# Patient Record
Sex: Female | Born: 1955 | Race: White | Hispanic: No | Marital: Married | Smoking: Former smoker
Health system: Southern US, Community
[De-identification: ages and names within clinical notes are randomized; demographics above are authoritative.]

---

## 1999-10-24 ENCOUNTER — Encounter (INDEPENDENT_AMBULATORY_CARE_PROVIDER_SITE_OTHER): Payer: Self-pay

## 1999-10-24 ENCOUNTER — Inpatient Hospital Stay (HOSPITAL_COMMUNITY): Admission: RE | Admit: 1999-10-24 | Discharge: 1999-10-26 | Payer: Self-pay | Admitting: *Deleted

## 2004-08-03 ENCOUNTER — Ambulatory Visit (HOSPITAL_COMMUNITY): Admission: RE | Admit: 2004-08-03 | Discharge: 2004-08-03 | Payer: Self-pay | Admitting: Family Medicine

## 2004-08-03 ENCOUNTER — Encounter: Admission: RE | Admit: 2004-08-03 | Discharge: 2004-08-03 | Payer: Self-pay | Admitting: Family Medicine

## 2004-08-13 ENCOUNTER — Encounter: Admission: RE | Admit: 2004-08-13 | Discharge: 2004-08-13 | Payer: Self-pay | Admitting: Family Medicine

## 2005-04-09 ENCOUNTER — Encounter: Admission: RE | Admit: 2005-04-09 | Discharge: 2005-04-09 | Payer: Self-pay | Admitting: Family Medicine

## 2005-07-15 IMAGING — US US EXTREM LOW NON VASC*R*
1 series · 14 of 25 positions shown · non-contrast
Comparison: none

CLINICAL DATA: Soft, palpable mass in the mid to upper right arm. 
RIGHT ARM ULTRASOUND ? 08/03/04
Sonographic examination of the palpable mass in the right arm was performed.  At that location, a 5.0 x 4.1 x 2.1 cm mass is demonstrated. This is mildly inhomogeneous and has smooth margins.  No other abnormalities are seen. 
IMPRESSION 
5.0 x 4.1 x 2.1 cm solid mass in the mid right arm, corresponding to the palpable abnormality.  This has nonspecific ultrasound features and most likely represents a lipoma.  However, ultrasound is not a modality with the ability to differentiate fatty from non-fatty masses.  Therefore, this could also represent a nonfatty mass.  MR or CT would be more specific for determining the presence or absence of fat in the mass.

[Series 1: unknown · 0.09mm/px · 14 of 26 slices shown]
[im 1/26]
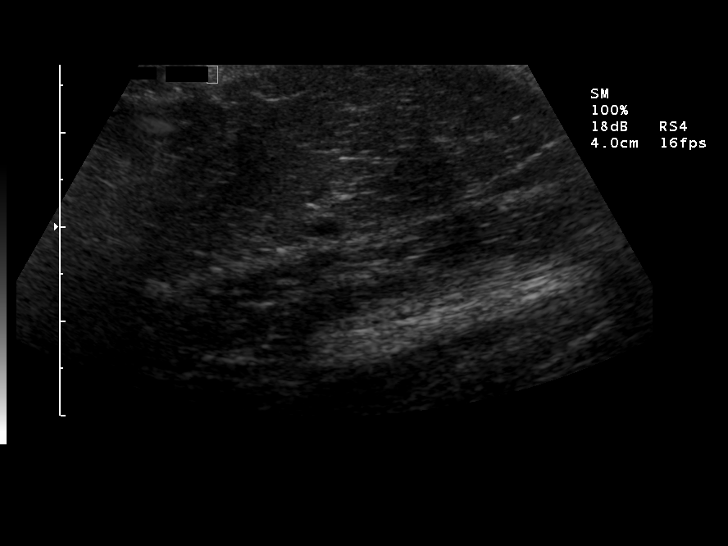
[im 3/26]
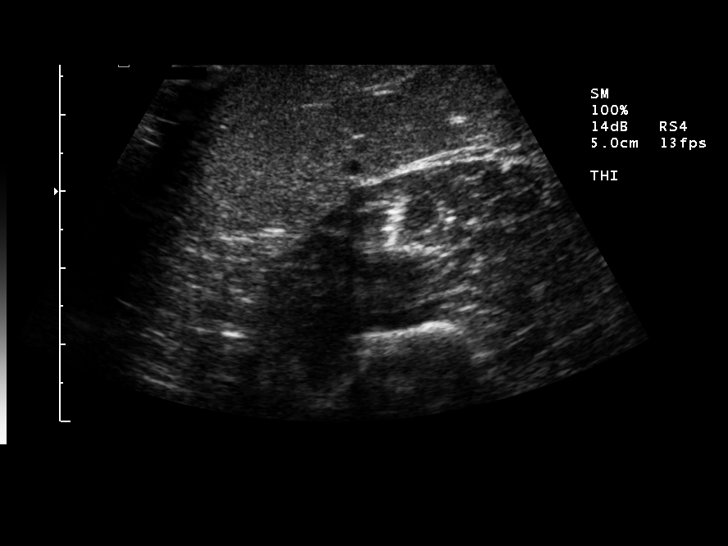
[im 5/26]
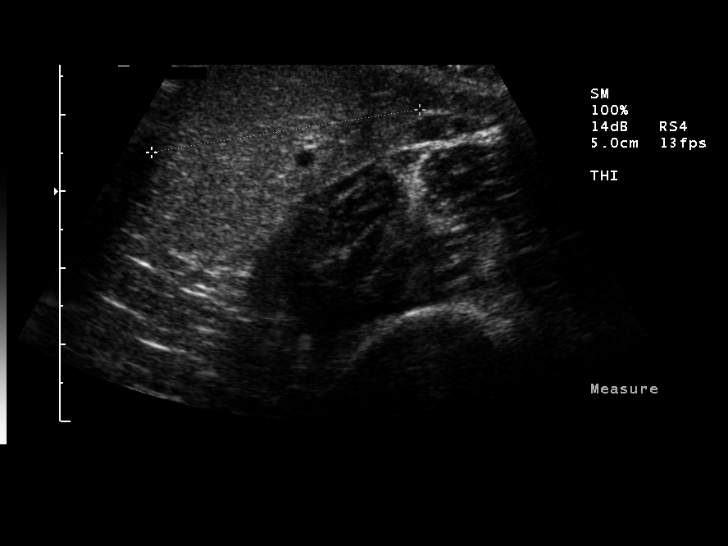
[im 7/26]
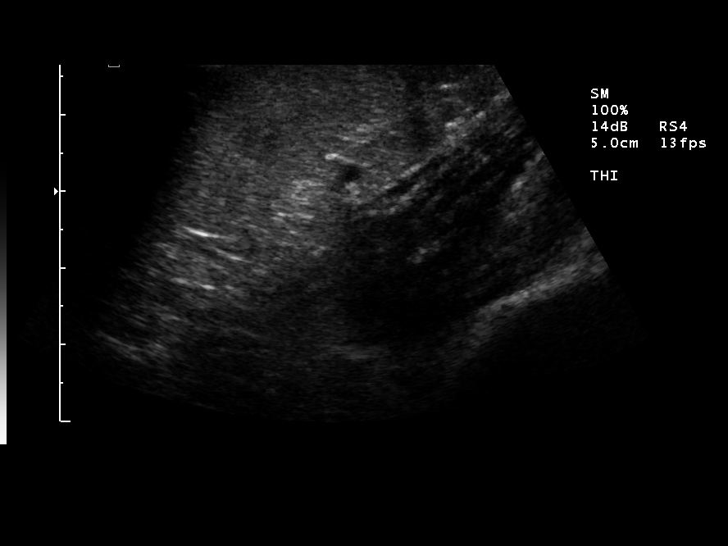
[im 9/26]
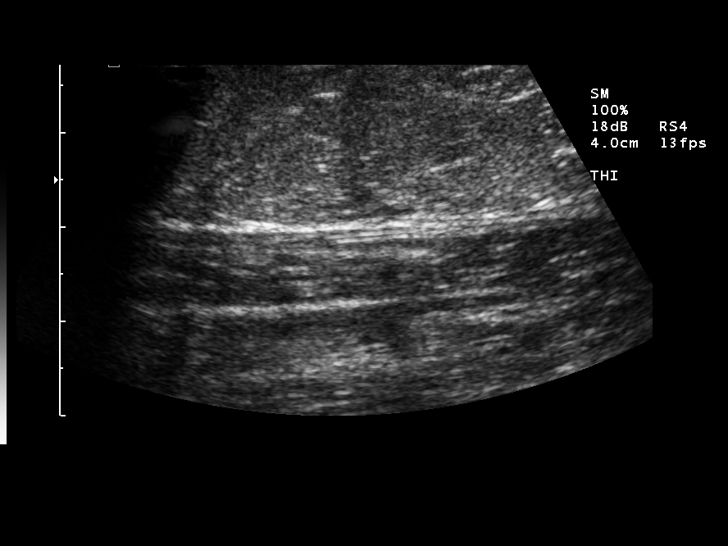
[im 10/26]
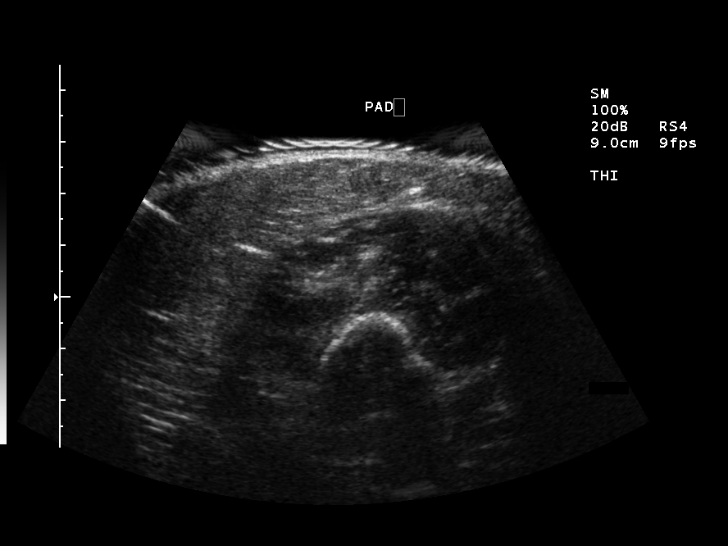
[im 12/26]
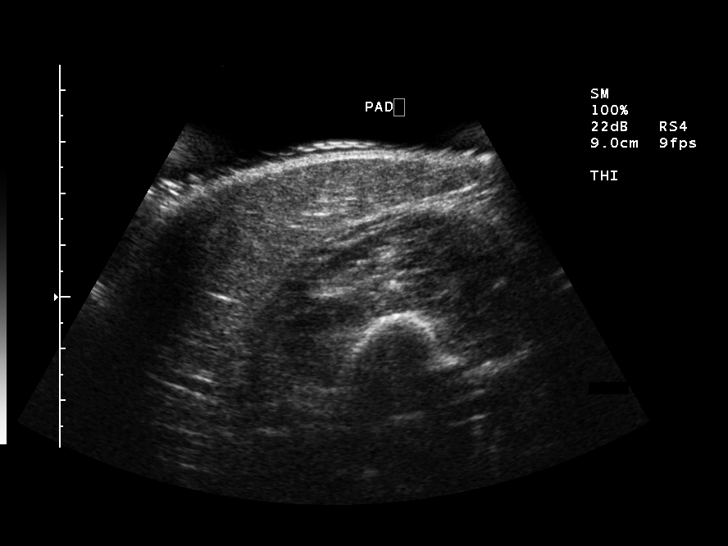
[im 14/26]
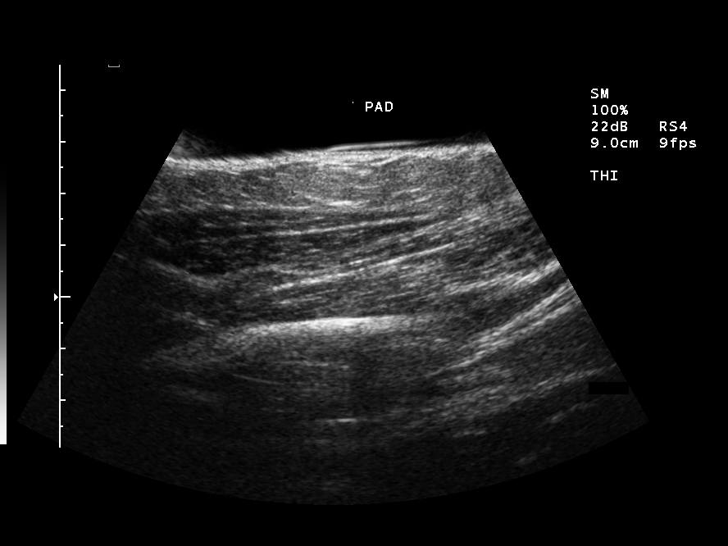
[im 16/26]
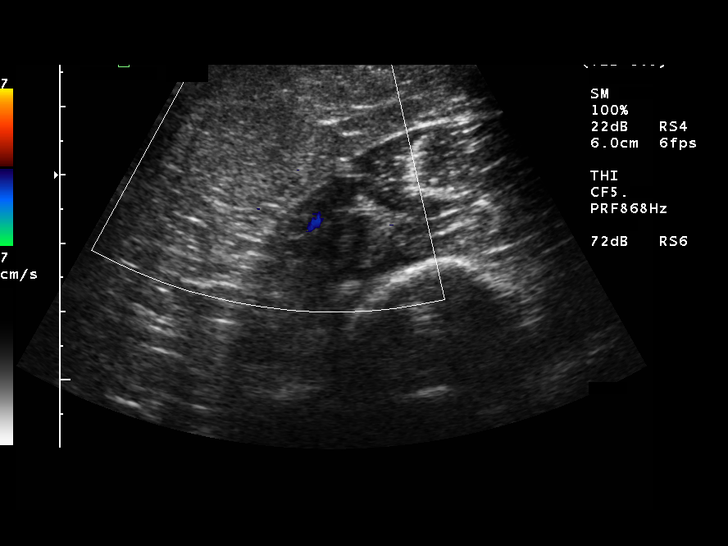
[im 17/26]
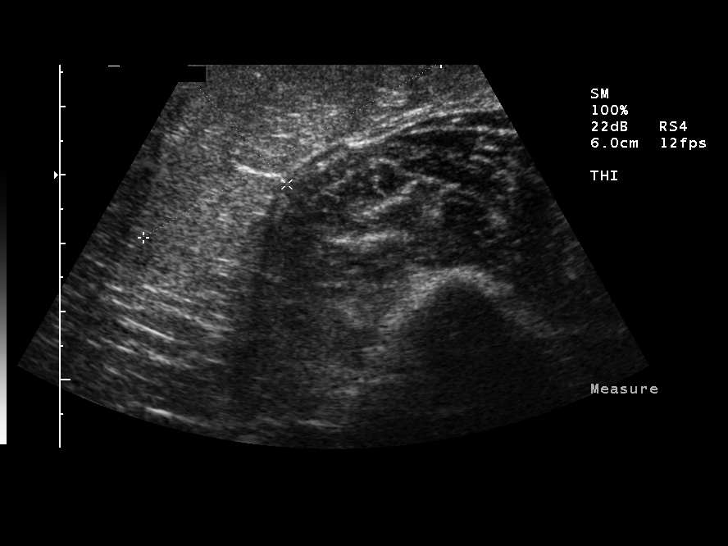
[im 19/26]
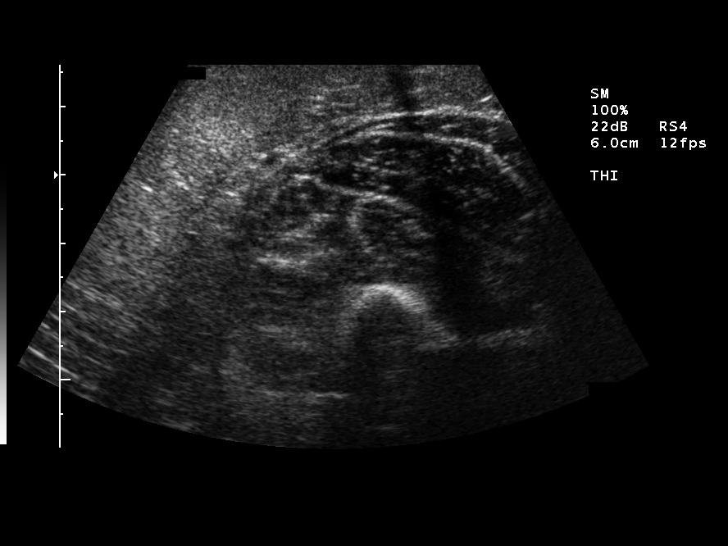
[im 21/26]
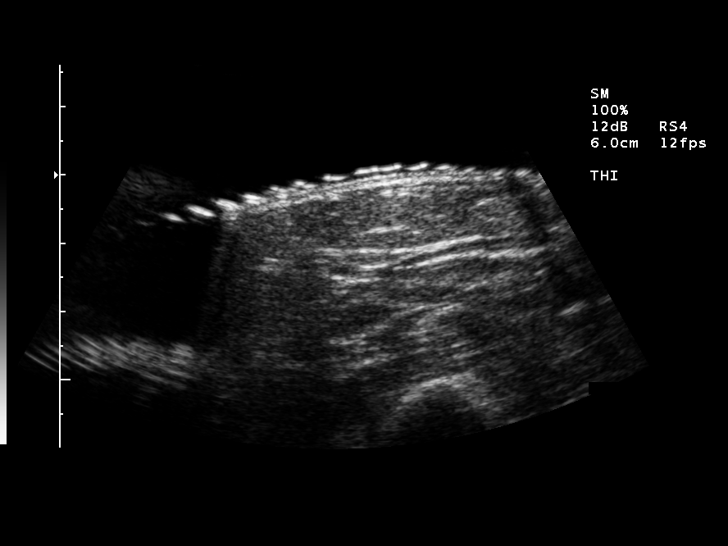
[im 23/26]
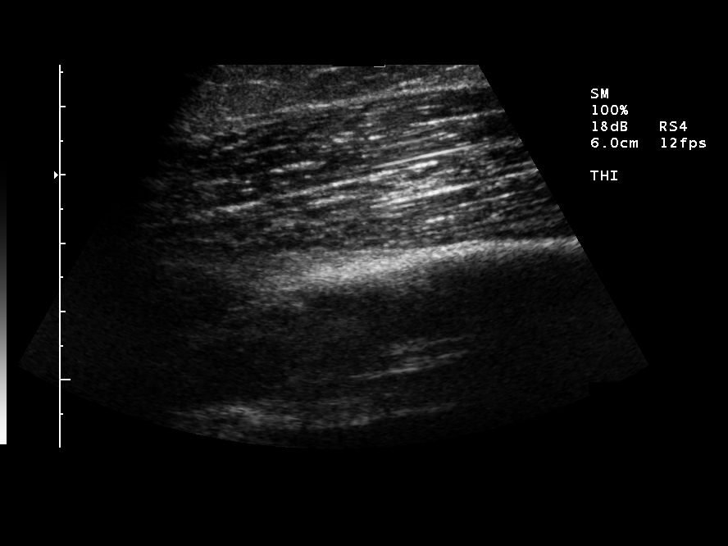
[im 26/26]
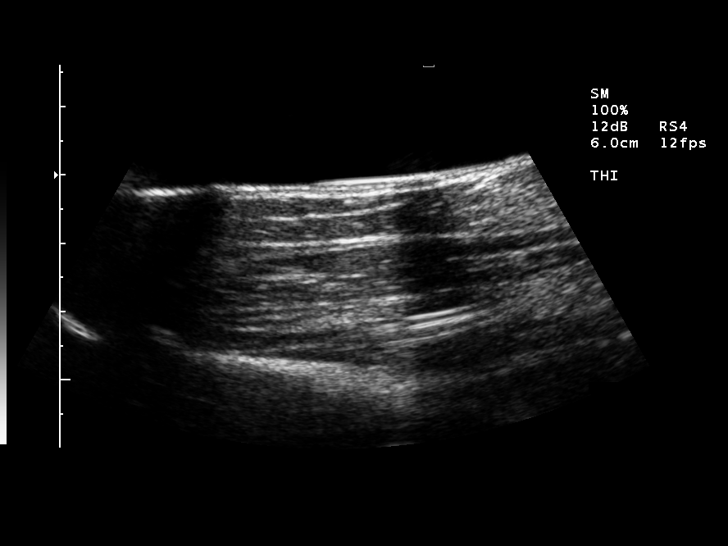

[14 of 25 positions shown; findings below may reference images not displayed]

## 2006-01-14 ENCOUNTER — Encounter: Admission: RE | Admit: 2006-01-14 | Discharge: 2006-01-14 | Payer: Self-pay | Admitting: Family Medicine

## 2007-01-30 ENCOUNTER — Encounter: Admission: RE | Admit: 2007-01-30 | Discharge: 2007-01-30 | Payer: Self-pay | Admitting: Family Medicine

## 2007-05-18 ENCOUNTER — Other Ambulatory Visit: Admission: RE | Admit: 2007-05-18 | Discharge: 2007-05-18 | Payer: Self-pay | Admitting: Family Medicine

## 2008-03-10 ENCOUNTER — Encounter: Admission: RE | Admit: 2008-03-10 | Discharge: 2008-03-10 | Payer: Self-pay | Admitting: Family Medicine

## 2009-03-20 ENCOUNTER — Encounter: Admission: RE | Admit: 2009-03-20 | Discharge: 2009-03-20 | Payer: Self-pay | Admitting: Family Medicine

## 2010-04-16 ENCOUNTER — Encounter: Admission: RE | Admit: 2010-04-16 | Discharge: 2010-04-16 | Payer: Self-pay | Admitting: Family Medicine

## 2010-04-20 ENCOUNTER — Encounter: Admission: RE | Admit: 2010-04-20 | Discharge: 2010-04-20 | Payer: Self-pay | Admitting: Family Medicine

## 2011-01-20 ENCOUNTER — Encounter: Payer: Self-pay | Admitting: Family Medicine

## 2011-02-28 ENCOUNTER — Encounter (HOSPITAL_BASED_OUTPATIENT_CLINIC_OR_DEPARTMENT_OTHER)
Admission: RE | Admit: 2011-02-28 | Discharge: 2011-02-28 | Disposition: A | Discharge status: Home / Self Care | Payer: BC Managed Care – PPO | Source: Ambulatory Visit | Attending: Orthopedic Surgery | Admitting: Orthopedic Surgery

## 2011-02-28 DIAGNOSIS — Z01810 Encounter for preprocedural cardiovascular examination: Secondary | ICD-10-CM | POA: Insufficient documentation

## 2011-03-01 ENCOUNTER — Other Ambulatory Visit: Payer: Self-pay | Admitting: Orthopedic Surgery

## 2011-03-01 ENCOUNTER — Ambulatory Visit (HOSPITAL_BASED_OUTPATIENT_CLINIC_OR_DEPARTMENT_OTHER)
Admission: RE | Admit: 2011-03-01 | Discharge: 2011-03-01 | Disposition: A | Discharge status: Home / Self Care | Payer: BC Managed Care – PPO | Source: Ambulatory Visit | Attending: Orthopedic Surgery | Admitting: Orthopedic Surgery

## 2011-03-01 DIAGNOSIS — D1739 Benign lipomatous neoplasm of skin and subcutaneous tissue of other sites: Secondary | ICD-10-CM | POA: Insufficient documentation

## 2011-03-01 DIAGNOSIS — Z01812 Encounter for preprocedural laboratory examination: Secondary | ICD-10-CM | POA: Insufficient documentation

## 2011-03-01 LAB — POCT HEMOGLOBIN-HEMACUE: Hemoglobin: 13.1 g/dL (ref 12.0–15.0)

## 2011-07-19 NOTE — Op Note (Signed)
  NAME:  Leslie Wilkerson, Leslie Wilkerson                  ACCOUNT NO.:  [id number]  MEDICAL RECORD NO.:  [id number]            PATIENT TYPE:  LOCATION:                                 FACILITY:  PHYSICIAN:  Cindee Salt, M.D.            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  OPERATION:  Excisional biopsy mass, left arm.  SURGEON:  Cindee Salt, MD  ANESTHESIA:  Infraclavicular block general anesthesia.  ANESTHESIOLOGIST:  Janetta Hora. Frederick, MD  HISTORY:  The patient is a 55 year old female with a history of a large mass in the antecubital fossa area, distal upper arm of her right arm. She is desirous of having this excised.  Pre, peri, postoperative courses have been discussed along with risks and complications.  She is aware that there is no guarantee with surgery; possibility of infection; recurrence to injury to arteries, nerves, tendons; complete relief of symptoms and dystrophy.  In the preoperative area, the patient is seen, the extremity marked by both the patient and surgeon, and antibiotic given.  PROCEDURE:  The patient was brought to the operating room where an infraclavicular block, general anesthetic were both carried out by Dr. Gelene Mink without difficulty.  She was prepped using ChloraPrep, supine position, free right arm free.  A 3-minute dry time was allowed.  A time- out was taken confirming the patient and procedure.  A sterile tourniquet was used after draping on the upper arm.  The limb was exsanguinated by an Esmarch bandage up to the mass and then elevation. Tourniquet placed high on the arm was inflated to 250 mmHg.  A straight incision was made over the mass, carried down through the subcutaneous tissue with blunt  and sharp dissection.  A multilobulated large, firm, fatty-type mass was immediately encountered with blunt and sharp dissection, this was dissected free, it measured approximately 10 x 10 x 5 cm in size.  The entire mass was removed, marked distally with a suture and sent to pathology.  The wound was copiously irrigated with saline.  Bleeders were electrocauterized throughout the procedure with bipolar.  The wound was copiously irrigated with saline.  The subcutaneous tissue was then closed with interrupted 4-0 Vicryl sutures and the skin with interrupted 4-0 Vicryl Rapide sutures.  A sterile compressive dressing was applied.  On deflation of the tourniquet, all fingers were immediately pinked.  She was taken to the recovery room for observation in satisfactory condition.  She will be discharged home to return to the [facility] in 1 week, on Vicodin.    ______________________________ Cindee Salt, M.D.   ______________________________ Cindee Salt, M.D.    GK/MEDQ  D:  03/01/2011  T:  03/02/2011  Job:  [id number] Electronically Signed by Cindee Salt M.D. on 07/19/2011 09:00:44 AM

## 2011-12-26 ENCOUNTER — Ambulatory Visit: Payer: Self-pay

## 2012-07-06 ENCOUNTER — Other Ambulatory Visit: Payer: Self-pay | Admitting: Family Medicine

## 2012-07-06 DIAGNOSIS — Z1231 Encounter for screening mammogram for malignant neoplasm of breast: Secondary | ICD-10-CM

## 2012-07-15 ENCOUNTER — Ambulatory Visit
Admission: RE | Admit: 2012-07-15 | Discharge: 2012-07-15 | Disposition: A | Discharge status: Home / Self Care | Payer: BC Managed Care – PPO | Source: Ambulatory Visit | Attending: Family Medicine | Admitting: Family Medicine

## 2012-07-15 DIAGNOSIS — Z1231 Encounter for screening mammogram for malignant neoplasm of breast: Secondary | ICD-10-CM

## 2013-06-08 ENCOUNTER — Other Ambulatory Visit: Payer: Self-pay | Admitting: Family Medicine

## 2013-06-08 ENCOUNTER — Other Ambulatory Visit: Payer: Self-pay

## 2013-06-08 DIAGNOSIS — Z1231 Encounter for screening mammogram for malignant neoplasm of breast: Secondary | ICD-10-CM

## 2013-07-16 ENCOUNTER — Ambulatory Visit: Payer: BC Managed Care – PPO

## 2014-11-30 ENCOUNTER — Ambulatory Visit (INDEPENDENT_AMBULATORY_CARE_PROVIDER_SITE_OTHER): Payer: BC Managed Care – PPO | Admitting: Family Medicine

## 2014-11-30 ENCOUNTER — Encounter: Payer: Self-pay | Admitting: Family Medicine

## 2014-11-30 VITALS — BP 116/74 | HR 73 | Temp 98.5°F | Resp 16 | Ht 63.0 in | Wt 164.4 lb

## 2014-11-30 DIAGNOSIS — Z1239 Encounter for other screening for malignant neoplasm of breast: Secondary | ICD-10-CM

## 2014-11-30 DIAGNOSIS — Z23 Encounter for immunization: Secondary | ICD-10-CM

## 2014-11-30 NOTE — Progress Notes (Signed)
   Subjective:    Patient ID: Leslie Wilkerson, female    DOB: 11-14-1956, 58 y.o.   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 She sees Dr. Harlan Stains for primary care and they could not see her in enough time for the patient to have her paperwork completed.   Last pap- last year Mammo- 7/13 Flu-had already Tdap- needs, will get today  Current outpatient prescriptions: levothyroxine (SYNTHROID, LEVOTHROID) 75 MCG tablet, Take 75 mcg by mouth daily before breakfast., Disp: , Rfl:   No past medical history on file. No past surgical history on file. No family history on file. History  Substance Use Topics  . Smoking status: Former Research scientist (life sciences)  . Smokeless tobacco: Not on file  . Alcohol Use: Not on file     Review of Systems  Constitutional: Negative for fever, chills and unexpected weight change.  Respiratory: Negative for chest tightness and shortness of breath.   Cardiovascular: Negative for chest pain.  Gastrointestinal: Negative for diarrhea and constipation.  Neurological: Negative for headaches.       Objective:   Physical Exam  Constitutional: She is oriented to person, place, and time. She appears well-developed and well-nourished.  HENT:  Head: Normocephalic and atraumatic.  Right Ear: Tympanic membrane, external ear and ear canal normal.  Left Ear: Tympanic membrane, external ear and ear canal normal.  Nose: Nose normal.  Mouth/Throat: Oropharynx is clear and moist.  Eyes: Conjunctivae are normal. Pupils are equal, round, and reactive to light.  Neck: Normal range of motion. Neck supple.  Cardiovascular: Normal rate, regular rhythm and normal heart sounds.   Pulmonary/Chest: Effort normal and breath sounds normal.  Abdominal: Soft. Bowel sounds are normal.  Musculoskeletal: Normal range of motion.  Lymphadenopathy:    She has no cervical adenopathy.  Neurological: She is alert  and oriented to person, place, and time. She has normal reflexes.  Skin: Skin is warm and dry.  Psychiatric: She has a normal mood and affect. Her behavior is normal. Judgment and thought content normal.  Vitals reviewed.     Assessment & Plan:  1. Screening for breast cancer - MM Digital Screening; Future  2. Need for Tdap vaccination - Tdap vaccine greater than or equal to 7yo IM   Elby Beck, FNP-BC  [facility]  12/02/2014 10:40 PM

## 2014-11-30 NOTE — Progress Notes (Signed)
   Subjective:    Patient ID: Leslie Wilkerson, female    DOB: 01-25-1956, 58 y.o.   MRN: [id number]  HPI    Review of Systems     Objective:   Physical Exam        Assessment & Plan:  Tuberculosis Symptom Questionnaire  Do you currently have any of the following symptoms?  1. Unexplained cough lasting more than 3 weeks? no  Unexplained fever lasting more than 3 weeks. no  3. Night Sweats (sweating that leaves the bedclothes and sheets wet)   no  4. Shortness of Breath no  5. Chest Pain no  6. Unintentional weight loss  no  7. Unexplained fatigue (very tired for no reason) no Tuberculosis Risk Questionnaire  1. {no Were you born outside the [place] in one of the following parts of the world: [place], [place], [place], [place] or [place]?    2. No Have you traveled outside the [place] and lived for more than one month in one of the following parts of the world: [place], [place], [place], [place] or [place]?    3. no Do you have a compromised immune system such as from any of the following conditions:HIV/AIDS, organ or bone marrow transplantation, diabetes, immunosuppressive medicines (e.g. Prednisone, Remicaide), leukemia, lymphoma, cancer of the head or neck, gastrectomy or jejunal bypass, end-stage renal disease (on dialysis), or silicosis?     4. No Have you ever or do you plan on working in: a residential care center, a health care facility, a jail or prison or homeless shelter?    5. No Have you ever: injected illegal drugs, used crack cocaine, lived in a homeless shelter  or been in jail or prison?     6. No Have you ever been exposed to anyone with infectious tuberculosis?

## 2015-06-22 ENCOUNTER — Other Ambulatory Visit: Payer: Self-pay | Admitting: Family Medicine

## 2015-06-22 ENCOUNTER — Ambulatory Visit
Admission: RE | Admit: 2015-06-22 | Discharge: 2015-06-22 | Disposition: A | Discharge status: Home / Self Care | Payer: BLUE CROSS/BLUE SHIELD | Source: Ambulatory Visit | Attending: Family Medicine | Admitting: Family Medicine

## 2015-06-22 DIAGNOSIS — M25571 Pain in right ankle and joints of right foot: Secondary | ICD-10-CM
